# Patient Record
Sex: Male | Born: 1960 | Race: White | Hispanic: No | Smoking: Never smoker
Health system: Southern US, Community
[De-identification: ages and names within clinical notes are randomized; demographics above are authoritative.]

---

## 2013-11-19 ENCOUNTER — Ambulatory Visit: Payer: Self-pay

## 2013-11-19 ENCOUNTER — Other Ambulatory Visit: Payer: Self-pay | Admitting: Occupational Medicine

## 2013-11-19 DIAGNOSIS — Z0000 Encounter for general adult medical examination without abnormal findings: Secondary | ICD-10-CM

## 2016-05-11 ENCOUNTER — Emergency Department (INDEPENDENT_AMBULATORY_CARE_PROVIDER_SITE_OTHER)
Admission: EM | Admit: 2016-05-11 | Discharge: 2016-05-11 | Disposition: A | Discharge status: Home / Self Care | Payer: Managed Care, Other (non HMO) | Source: Home / Self Care | Attending: Family Medicine | Admitting: Family Medicine

## 2016-05-11 ENCOUNTER — Encounter: Payer: Self-pay | Admitting: Emergency Medicine

## 2016-05-11 ENCOUNTER — Emergency Department (INDEPENDENT_AMBULATORY_CARE_PROVIDER_SITE_OTHER): Payer: Managed Care, Other (non HMO)

## 2016-05-11 DIAGNOSIS — M25461 Effusion, right knee: Secondary | ICD-10-CM | POA: Diagnosis not present

## 2016-05-11 DIAGNOSIS — M2391 Unspecified internal derangement of right knee: Secondary | ICD-10-CM | POA: Diagnosis not present

## 2016-05-11 DIAGNOSIS — M1711 Unilateral primary osteoarthritis, right knee: Secondary | ICD-10-CM

## 2016-05-11 DIAGNOSIS — M129 Arthropathy, unspecified: Secondary | ICD-10-CM

## 2016-05-11 DIAGNOSIS — M25861 Other specified joint disorders, right knee: Secondary | ICD-10-CM | POA: Diagnosis not present

## 2016-05-11 DIAGNOSIS — M25561 Pain in right knee: Secondary | ICD-10-CM | POA: Diagnosis not present

## 2016-05-11 NOTE — ED Notes (Signed)
Pt c/o right knee "locking up" on him about one week ago. Pain comes and goes, usually can walk it out, some swelling.

## 2016-05-11 NOTE — ED Provider Notes (Signed)
CSN: [id number]     Arrival date & time 05/11/16  0915 History   First MD Initiated Contact with Patient 05/11/16 [phone]     Chief Complaint  Patient presents with  . Joint Swelling   (Consider location/radiation/quality/duration/timing/severity/associated sxs/prior Treatment) HPI Victor Michael is a 55 y.o. male presenting to UC with c/o Right knee "locking up" intermittently over the last 1 week.  Associated aching soreness with swelling just above his knee. He denies pain at this time but pain is mild to moderate in severity when it locks up.  Hx of similar symptoms but states he is normally able to "walk it out" but the other day it seemed to have "locked" for a more prolonged time. He does not recall any specific injuries but notes he is on his feet a lot during the day walking and going up and down stairs. No prior knee surgeries.    History reviewed. No pertinent past medical history. History reviewed. No pertinent past surgical history. Family History  Problem Relation Age of Onset  . Cancer Mother   . Diabetes Mother   . Prostate cancer Father    Social History  Substance Use Topics  . Smoking status: Never Smoker   . Smokeless tobacco: None  . Alcohol Use: None    Review of Systems  Musculoskeletal: Positive for myalgias, joint swelling and arthralgias.       Right knee  Skin: Negative for color change and wound.  Neurological: Negative for weakness and numbness.    Allergies  Review of patient's allergies indicates no known allergies.  Home Medications   Prior to Admission medications   Not on File   Meds Ordered and Administered this Visit  Medications - No data to display  BP 111/70 mmHg  Pulse 78  Temp(Src) 98.3 F (36.8 C) (Oral)  Ht 5\' 11"  (1.803 m)  Wt 175 lb (79.379 kg)  BMI 24.42 kg/m2  SpO2 98% No data found.   Physical Exam  Constitutional: He is oriented to person, place, and time. He appears well-developed and well-nourished.  HENT:  Head:  Normocephalic and atraumatic.  Eyes: EOM are normal.  Neck: Normal range of motion.  Cardiovascular: Normal rate.   Pulmonary/Chest: Effort normal.  Musculoskeletal: He exhibits edema and tenderness.  Right knee: mild edema to superior aspect, tender. Able to flex knee to 90 degrees, limited due to pain. Full extension. No crepitus. Calf is soft, non-tender.  Neurological: He is alert and oriented to person, place, and time.  Skin: Skin is warm and dry. No erythema.  Right knee: skin in tact, no ecchymosis or erythema.  Psychiatric: He has a normal mood and affect. His behavior is normal.  Nursing note and vitals reviewed.   ED Course  Procedures (including critical care time)  Labs Review Labs Reviewed - No data to display  Imaging Review Dg Knee Complete 4 Views Right  05/11/2016  CLINICAL DATA:  Right knee pain first 2 weeks, no known injury, initial encounter EXAM: RIGHT KNEE - COMPLETE 4+ VIEW COMPARISON:  None. FINDINGS: No acute fracture or dislocation is noted. Multiple small calcified densities are noted in the joint space consistent with loose bodies. No joint effusion is seen. No other focal abnormality is noted. IMPRESSION: Multiple calcified loose bodies.  No acute bony abnormality noted. Electronically Signed   By: Alcide CleverMark  Lukens M.D.   On: 05/11/2016 09:56     MDM   1. Right knee pain   2. Knee locking, right  3. Knee swelling, right   4. Arthritis of knee, right    Pt c/o Right knee locking, pain and swelling. No evidence of septic joint.  Plain films significant for multiple calcified loose bodies. No acute bony abnormality noted.  Discussed imaging with pt. Recommend f/u with Sports Medicine and/or Orthopedic surgeon.  Pt notes he does have a knee brace at home he can use in meantime. Home care instructions provided. Patient verbalized understanding and agreement with treatment plan.     Junius Finnerrin O'Malley, PA-C 05/11/16 1132

## 2017-08-18 IMAGING — DX DG KNEE COMPLETE 4+V*R*
4 series · 4 of 4 positions shown · non-contrast
Comparison: None.

CLINICAL DATA: Right knee pain first 2 weeks, no known injury,
initial encounter

EXAM:
RIGHT KNEE - COMPLETE 4+ VIEW

[knee ap]
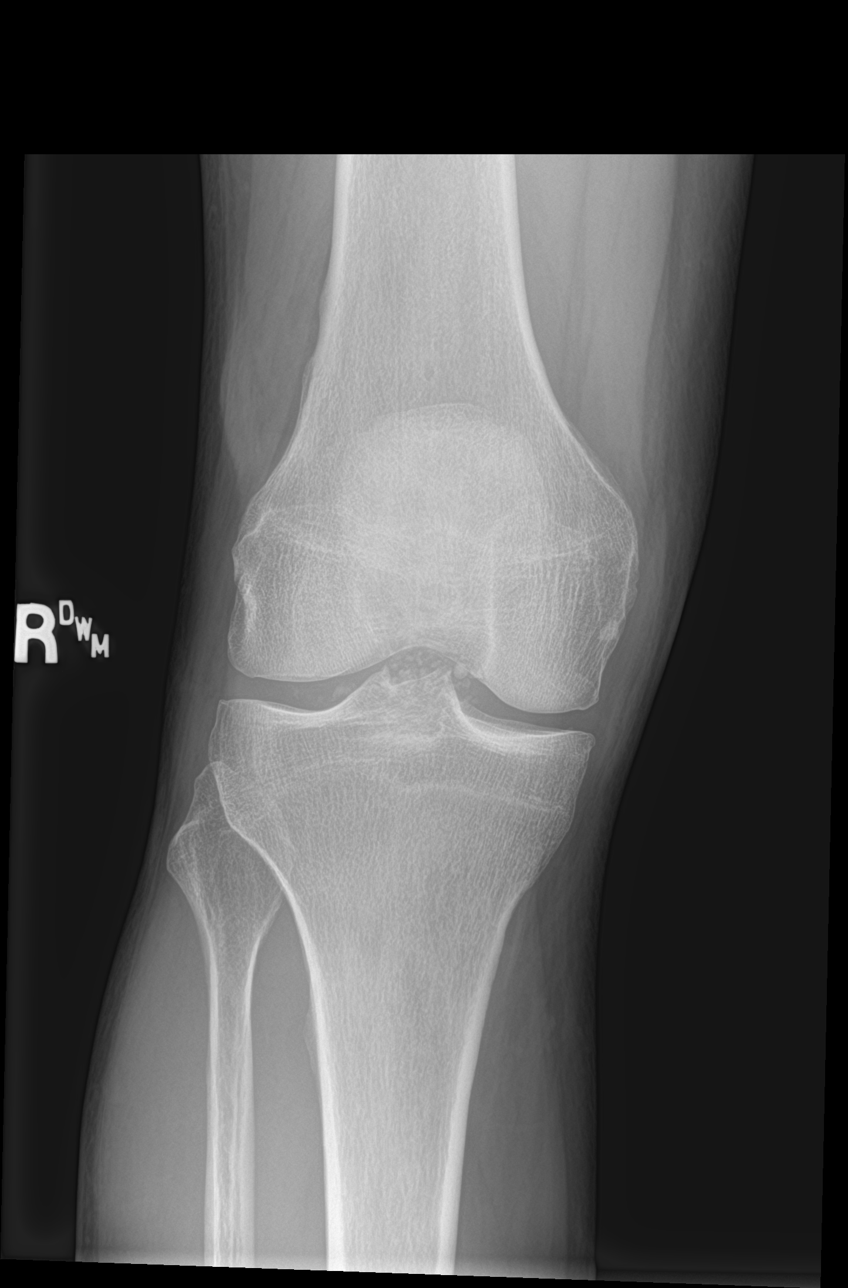

[tunnel]
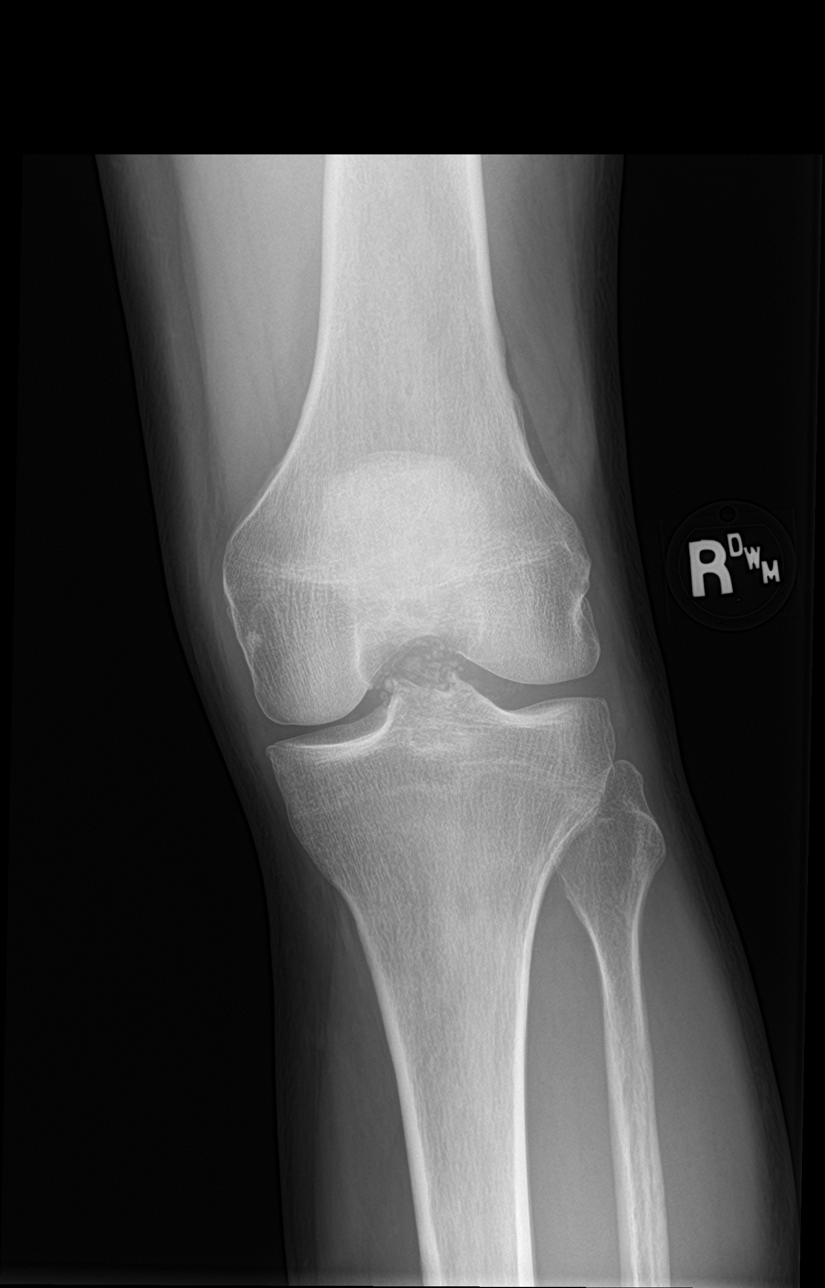

[knee lat]
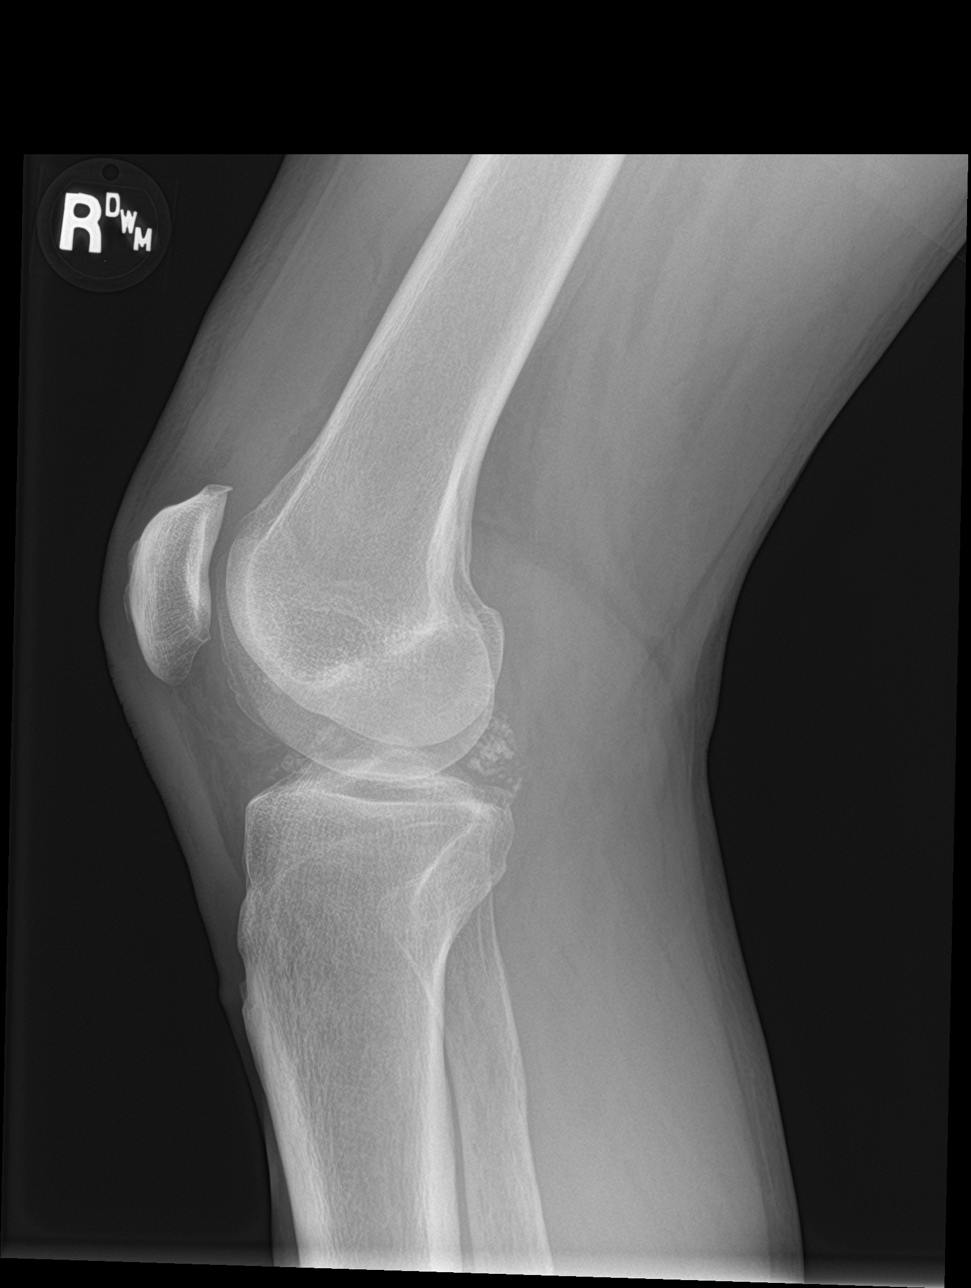

[knee sunrise]
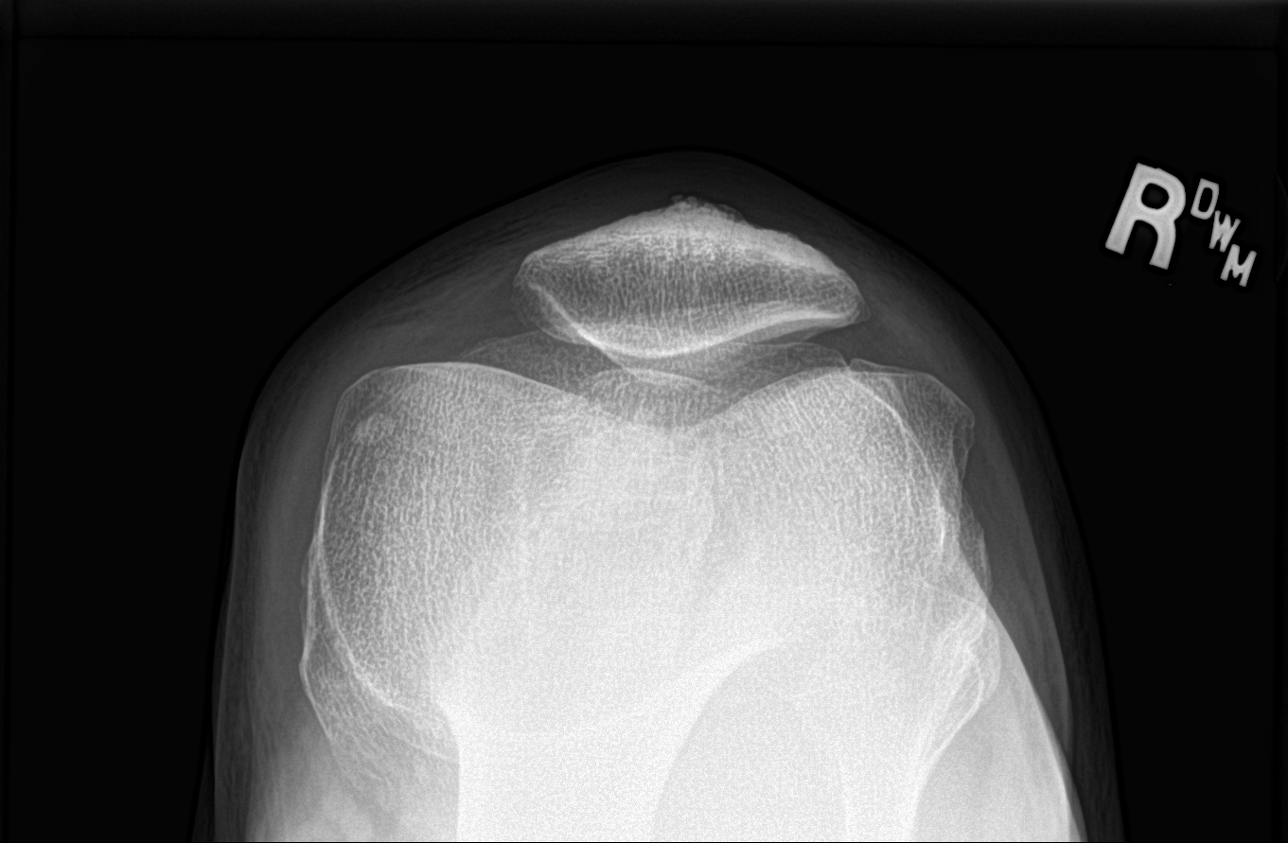

[4 of 4 positions shown; findings below may reference images not displayed]

FINDINGS: No acute fracture or dislocation is noted. Multiple small calcified
densities are noted in the joint space consistent with loose bodies.
No joint effusion is seen. No other focal abnormality is noted.
IMPRESSION: Multiple calcified loose bodies.  No acute bony abnormality noted.
